# Patient Record
Sex: Male | Born: 1995 | Hispanic: Refuse to answer | Marital: Single | Smoking: Never smoker
Health system: Southern US, Community
[De-identification: ages and names within clinical notes are randomized; demographics above are authoritative.]

## PROBLEM LIST (undated history)

## (undated) DIAGNOSIS — F32A Depression, unspecified: Secondary | ICD-10-CM

## (undated) DIAGNOSIS — F329 Major depressive disorder, single episode, unspecified: Secondary | ICD-10-CM

## (undated) HISTORY — DX: Major depressive disorder, single episode, unspecified: F32.9

## (undated) HISTORY — PX: OTHER SURGICAL HISTORY: SHX169

## (undated) HISTORY — DX: Depression, unspecified: F32.A

---

## 2018-11-23 ENCOUNTER — Ambulatory Visit (INDEPENDENT_AMBULATORY_CARE_PROVIDER_SITE_OTHER): Payer: PRIVATE HEALTH INSURANCE | Admitting: Family Medicine

## 2018-11-23 ENCOUNTER — Ambulatory Visit
Admission: RE | Admit: 2018-11-23 | Discharge: 2018-11-23 | Disposition: A | Discharge status: Home / Self Care | Payer: PRIVATE HEALTH INSURANCE | Source: Ambulatory Visit | Attending: Family Medicine | Admitting: Family Medicine

## 2018-11-23 ENCOUNTER — Ambulatory Visit
Admission: RE | Admit: 2018-11-23 | Discharge: 2018-11-23 | Disposition: A | Discharge status: Home / Self Care | Payer: PRIVATE HEALTH INSURANCE | Attending: Family Medicine | Admitting: Family Medicine

## 2018-11-23 ENCOUNTER — Encounter: Payer: Self-pay | Admitting: Family Medicine

## 2018-11-23 VITALS — Temp 98.2°F | Resp 14

## 2018-11-23 DIAGNOSIS — M79672 Pain in left foot: Secondary | ICD-10-CM

## 2018-11-23 MED ORDER — NAPROXEN 500 MG PO TABS: 500.0000 mg | ORAL_TABLET | Freq: Two times a day (BID) | ORAL | 0 refills | Status: AC

## 2018-11-23 NOTE — Progress Notes (Signed)
Patient presents today with symptoms of left foot pain.  Patient states that during baseball yesterday he was running and felt some discomfort in his left foot.  He denies any trauma to the foot.  He was able to finish practice without any significant issues.  Later on in the day he noticed increased pain of his foot with weightbearing.  He use compression and ice yesterday on the foot.  He denies any significant bruising or swelling of the area today.  He denies any previous history of any foot problems.  He reported to his athletic trainer today and was put in a walking boot.  He admits that being in the walking boot does help with his pain.  He did take Ibuprofen earlier today for his pain.  ROS: Negative except mentioned above. Vitals as per Epic.  GENERAL: NAD MSK: L Foot -no obvious ecchymosis or swelling appreciated, no warmth of the area, focal area of tenderness along the proximal first and second metatarsal area, full range of motion but discomfort with dorsi and plantar flexion, N/V intact NEURO: CN II-XII grossly intact   A/P: Left Foot Pain - will do x-rays to evaluate further, will do weightbearing views and compare with the right foot as well, stay in walking boot for now, Naprosyn prescribed, ice as needed, will discuss above plan with athletic trainer.  Seek immediate medical attention if any acute problems.

## 2019-07-05 ENCOUNTER — Other Ambulatory Visit: Payer: Self-pay

## 2019-07-05 DIAGNOSIS — Z20822 Contact with and (suspected) exposure to covid-19: Secondary | ICD-10-CM

## 2019-07-07 LAB — NOVEL CORONAVIRUS, NAA: SARS-CoV-2, NAA: DETECTED — AB

## 2019-07-12 ENCOUNTER — Other Ambulatory Visit: Payer: Self-pay | Admitting: Family Medicine

## 2019-07-12 DIAGNOSIS — Z8616 Personal history of COVID-19: Secondary | ICD-10-CM

## 2019-07-12 DIAGNOSIS — I400 Infective myocarditis: Secondary | ICD-10-CM

## 2019-07-12 DIAGNOSIS — Z8619 Personal history of other infectious and parasitic diseases: Secondary | ICD-10-CM

## 2019-07-15 ENCOUNTER — Other Ambulatory Visit: Payer: Self-pay

## 2019-07-15 ENCOUNTER — Other Ambulatory Visit: Payer: PRIVATE HEALTH INSURANCE | Admitting: Family Medicine

## 2019-07-15 DIAGNOSIS — Z0000 Encounter for general adult medical examination without abnormal findings: Secondary | ICD-10-CM

## 2019-07-16 LAB — TROPONIN I: Troponin I: <0.01 ng/mL (ref 0.00–0.04)

## 2019-07-30 ENCOUNTER — Ambulatory Visit (INDEPENDENT_AMBULATORY_CARE_PROVIDER_SITE_OTHER): Payer: PRIVATE HEALTH INSURANCE

## 2019-07-30 ENCOUNTER — Other Ambulatory Visit: Payer: Self-pay

## 2019-07-30 DIAGNOSIS — I400 Infective myocarditis: Secondary | ICD-10-CM | POA: Diagnosis not present

## 2019-07-30 DIAGNOSIS — U071 COVID-19: Secondary | ICD-10-CM

## 2019-07-30 DIAGNOSIS — Z8619 Personal history of other infectious and parasitic diseases: Secondary | ICD-10-CM

## 2019-07-30 DIAGNOSIS — Z8616 Personal history of COVID-19: Secondary | ICD-10-CM

## 2019-10-14 IMAGING — CR DG FOOT COMPLETE 3+V*L*
1 series · 3 of 3 positions shown · non-contrast
Comparison: Contralateral right foot radiograph 11/23/2018

CLINICAL DATA: Injury, left foot pain base of second and third
metatarsals

EXAM:
LEFT FOOT - COMPLETE 3+ VIEW

[Series 1: dg foot complete left · 0.14mm/px · 3 of 3 slices shown]
[im 1/3]
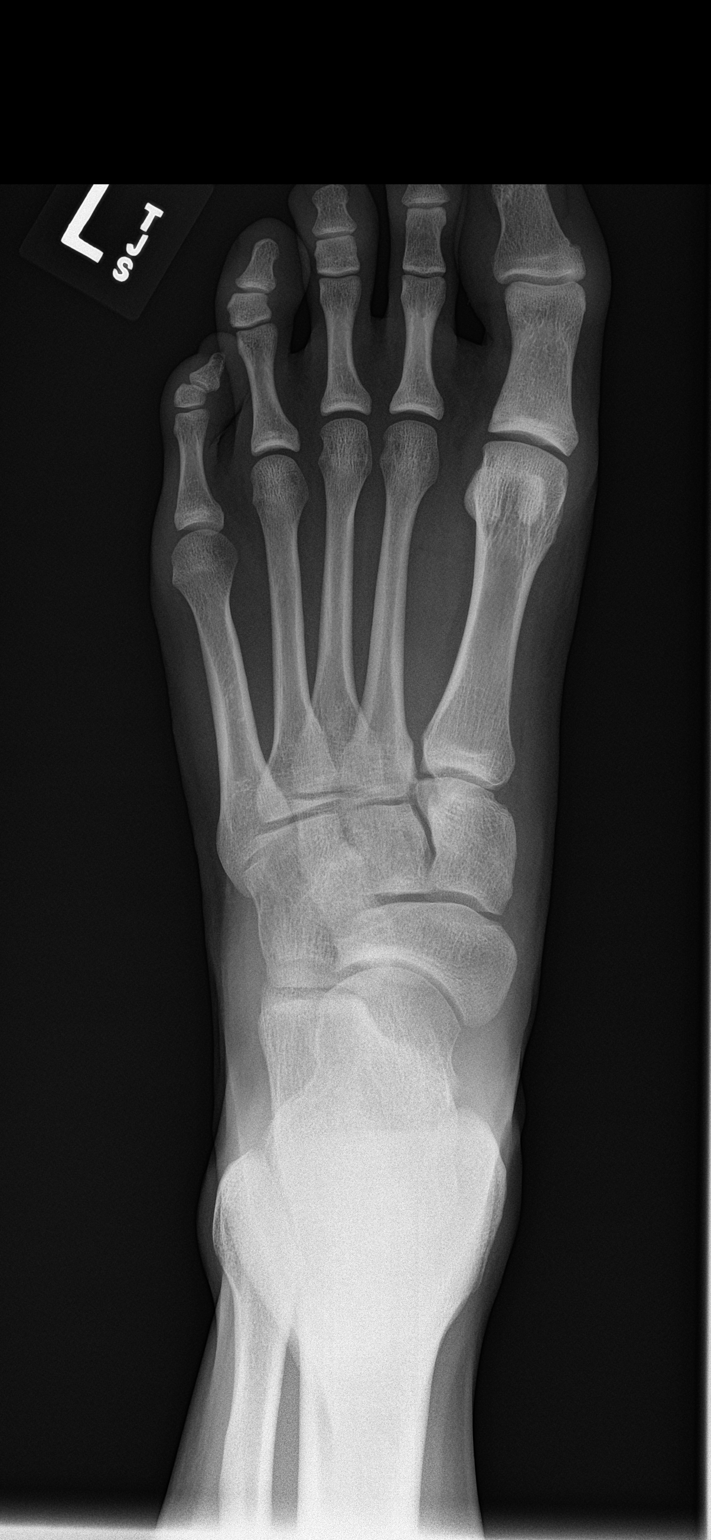
[im 2/3]
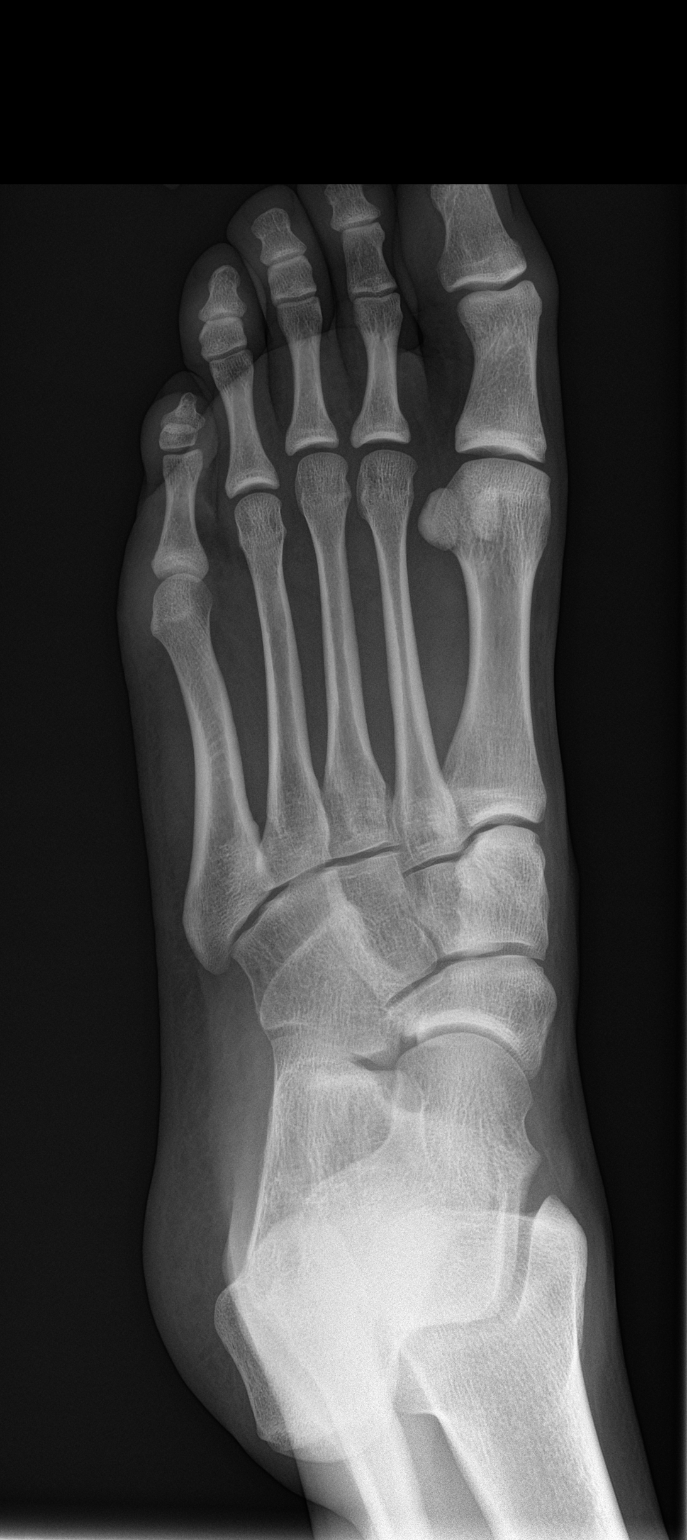
[im 3/3]
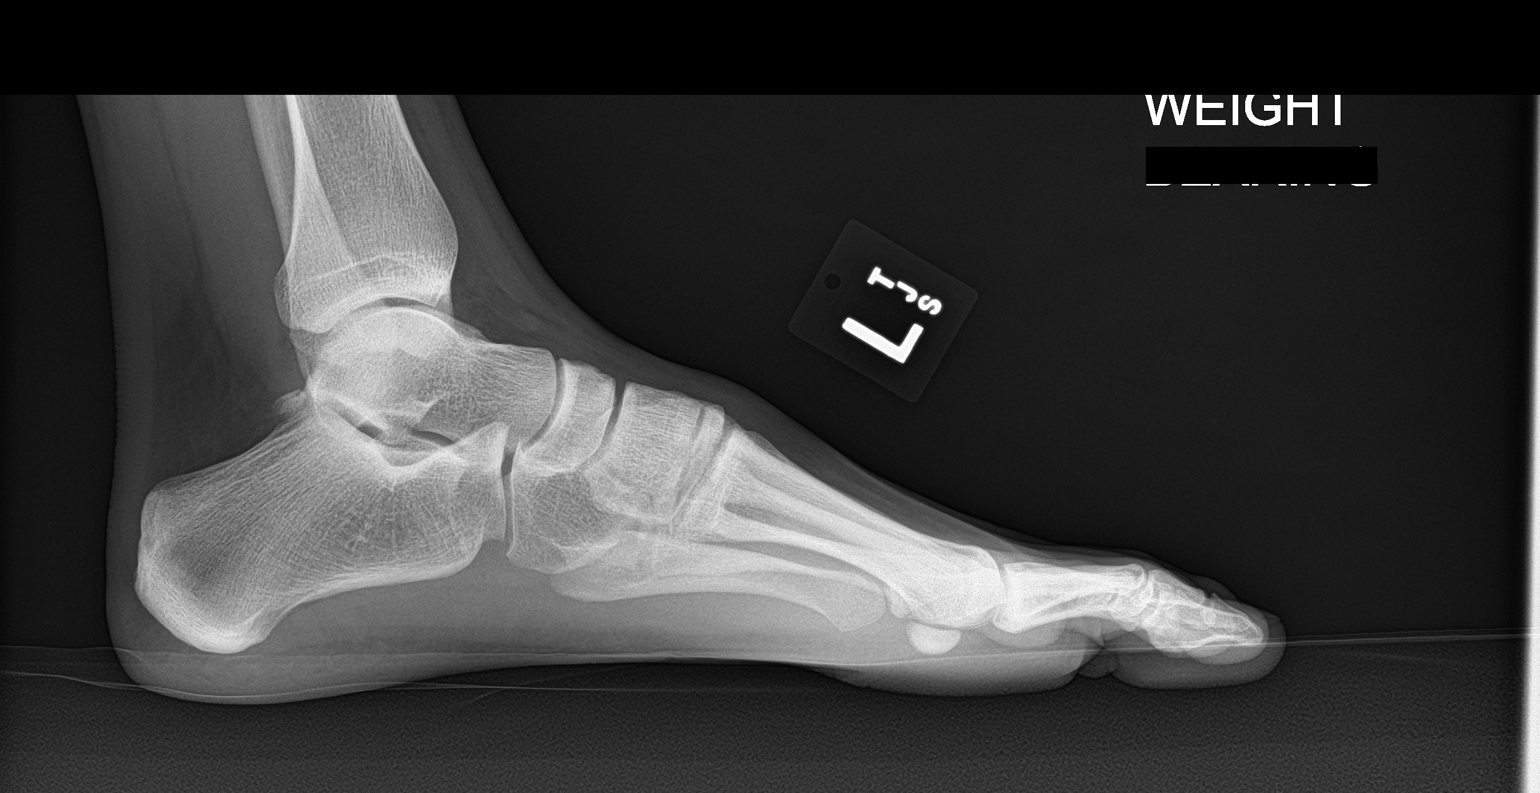

[3 of 3 positions shown; findings below may reference images not displayed]

FINDINGS: There is no evidence of fracture or dislocation. There is no
evidence of arthropathy or other focal bone abnormality. Soft
tissues are unremarkable.
IMPRESSION: Negative.

## 2019-10-14 IMAGING — CR DG FOOT 2V*R*
1 series · 2 of 2 positions shown · non-contrast
Comparison: None.

CLINICAL DATA: Pain at base of second and third metatarsals.
Evaluate for Lisfranc injury.

EXAM:
RIGHT FOOT - 2 VIEW

[Series 1: dg foot 2 views right · 0.14mm/px · 2 of 2 slices shown]
[im 1/2]
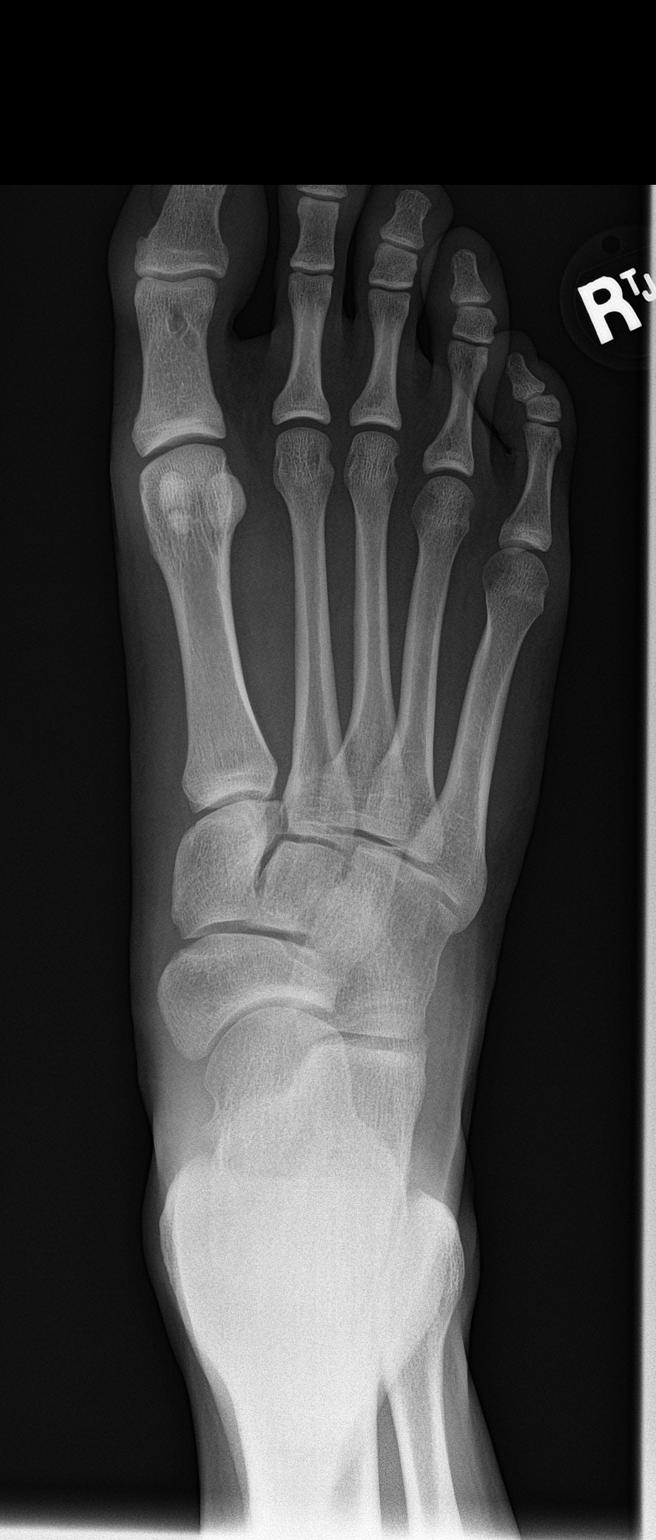
[im 2/2]
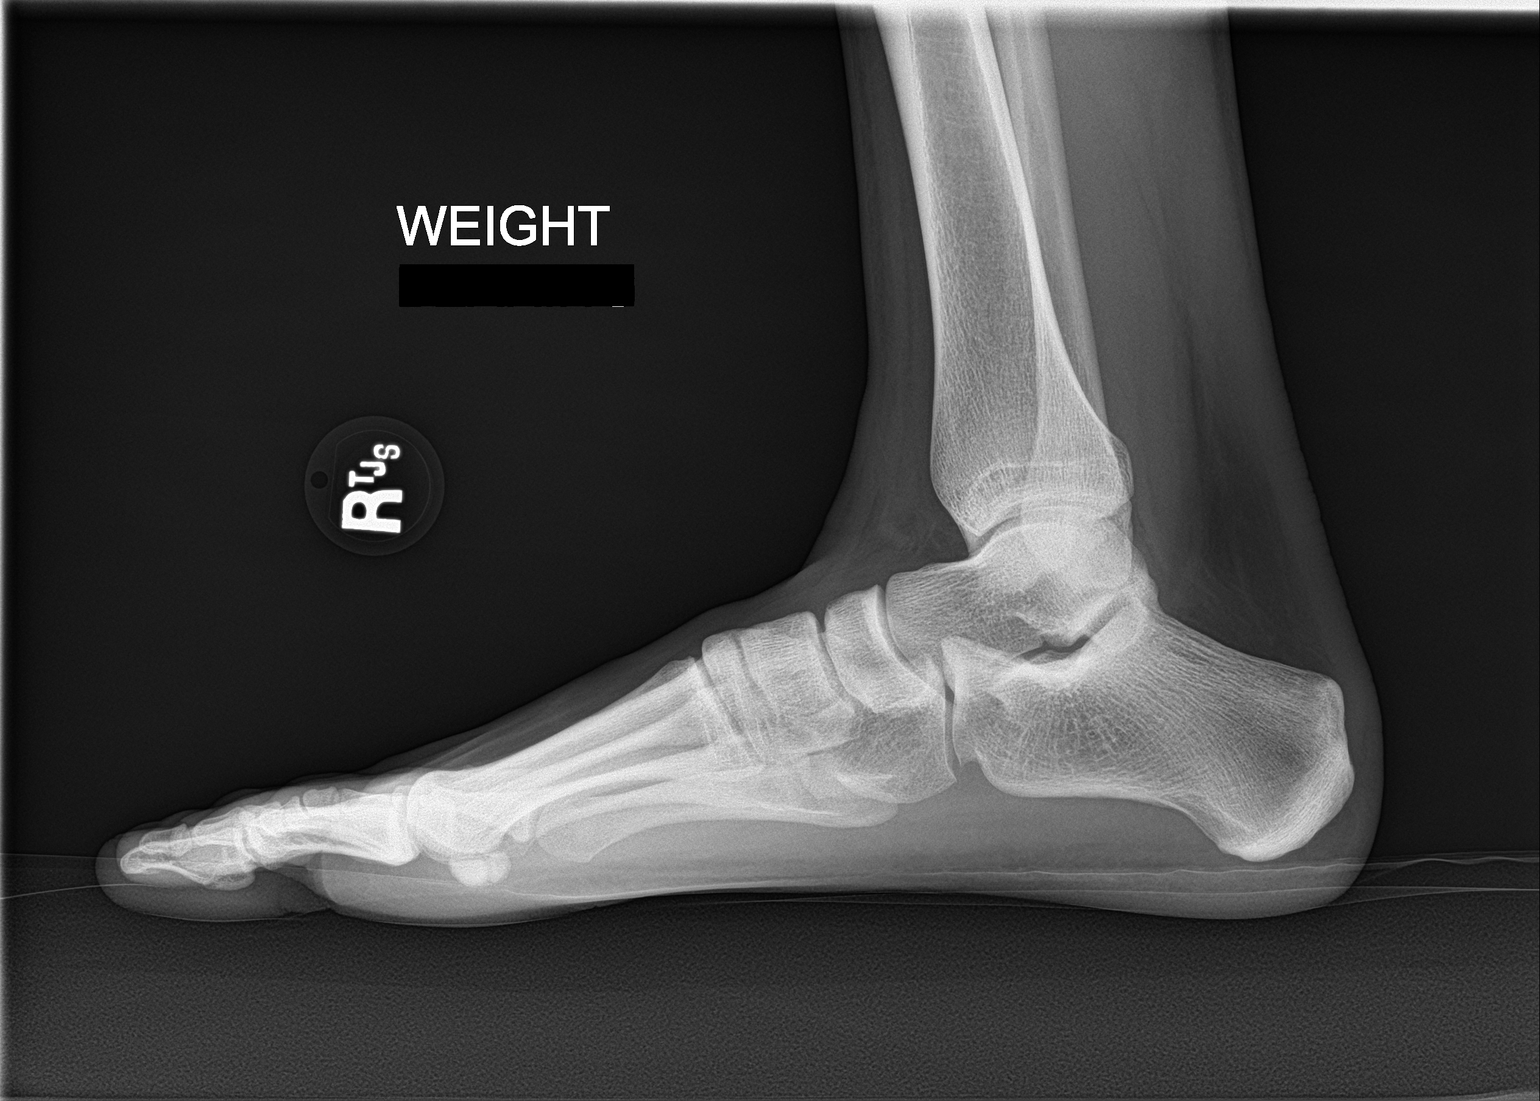

[2 of 2 positions shown; findings below may reference images not displayed]

FINDINGS: There is no evidence of fracture or dislocation. There is no
evidence of arthropathy or other focal bone abnormality. Soft
tissues are unremarkable.
IMPRESSION: Negative.
